# Patient Record
Sex: Female | Born: 1980 | Race: White | Hispanic: No | Marital: Married | Smoking: Never smoker
Health system: Southern US, Community
[De-identification: ages and names within clinical notes are randomized; demographics above are authoritative.]

## PROBLEM LIST (undated history)

## (undated) DIAGNOSIS — O139 Gestational [pregnancy-induced] hypertension without significant proteinuria, unspecified trimester: Secondary | ICD-10-CM

## (undated) DIAGNOSIS — Z789 Other specified health status: Secondary | ICD-10-CM

## (undated) HISTORY — PX: APPENDECTOMY: SHX54

## (undated) HISTORY — DX: Gestational (pregnancy-induced) hypertension without significant proteinuria, unspecified trimester: O13.9

## (undated) HISTORY — PX: CHOLECYSTECTOMY: SHX55

---

## 2015-05-29 LAB — OB RESULTS CONSOLE ANTIBODY SCREEN: ANTIBODY SCREEN: NEGATIVE

## 2015-05-29 LAB — OB RESULTS CONSOLE HEPATITIS B SURFACE ANTIGEN: Hepatitis B Surface Ag: NEGATIVE

## 2015-05-29 LAB — OB RESULTS CONSOLE GC/CHLAMYDIA
Chlamydia: NEGATIVE
Gonorrhea: NEGATIVE

## 2015-05-29 LAB — OB RESULTS CONSOLE ABO/RH: RH Type: POSITIVE

## 2015-05-29 LAB — OB RESULTS CONSOLE RUBELLA ANTIBODY, IGM: Rubella: IMMUNE

## 2015-05-29 LAB — OB RESULTS CONSOLE HIV ANTIBODY (ROUTINE TESTING): HIV: NONREACTIVE

## 2015-06-17 ENCOUNTER — Ambulatory Visit (HOSPITAL_COMMUNITY)
Admission: RE | Admit: 2015-06-17 | Discharge: 2015-06-17 | Disposition: A | Discharge status: Home / Self Care | Payer: 59 | Source: Ambulatory Visit | Attending: Obstetrics & Gynecology | Admitting: Obstetrics & Gynecology

## 2015-06-17 ENCOUNTER — Other Ambulatory Visit (HOSPITAL_COMMUNITY): Payer: Self-pay | Admitting: Obstetrics & Gynecology

## 2015-06-17 ENCOUNTER — Encounter (HOSPITAL_COMMUNITY): Payer: Self-pay

## 2015-06-17 ENCOUNTER — Encounter (HOSPITAL_COMMUNITY): Payer: Self-pay | Admitting: Obstetrics & Gynecology

## 2015-06-17 DIAGNOSIS — Z3689 Encounter for other specified antenatal screening: Secondary | ICD-10-CM

## 2015-06-17 DIAGNOSIS — Z3A18 18 weeks gestation of pregnancy: Secondary | ICD-10-CM

## 2015-06-17 DIAGNOSIS — O289 Unspecified abnormal findings on antenatal screening of mother: Secondary | ICD-10-CM

## 2015-06-17 DIAGNOSIS — O280 Abnormal hematological finding on antenatal screening of mother: Secondary | ICD-10-CM

## 2015-06-17 DIAGNOSIS — Z3A13 13 weeks gestation of pregnancy: Secondary | ICD-10-CM

## 2015-06-17 NOTE — Progress Notes (Signed)
  Genetic Counseling  DOB: March 21, 1980 Referring Provider: Mitchel HonourMorris, Megan, DO Appointment Date: 06/17/2015 Attending: Dr. Alpha GulaPaul Whitecar  [name] and her husband Mr. Katie Smith, were seen for genetic counseling because of an increased risk for fetal Trisomy 18 or 13 based on a first trimester scree.  In summary:  Reviewed results of screening test  Increased risk for Trisomy 18/13  Discussed additional screening options  NIPS - performed today  Ultrasound - already scheduled for June  Discussed diagnostic testing options  Amniocentesis and CVS declined  No family history concerns   They were counseled regarding the screening result and the associated 1 in 6971 risk for fetal trisomy 18/trisomy 13.  We reviewed chromosomes, nondisjunction, and the common features and poor prognosis of trisomy 2118 and trisomy 3713.  In addition, we reviewed the screen adjusted reduction in risks for Down syndrome.  We also discussed other explanations for a screen positive result including: differences in maternal metabolism and normal variation.  We reviewed other available screening options including noninvasive prenatal screening (NIPS)/cell free DNA (cfDNA) screening, and detailed ultrasound.  They were counseled that screening tests are used to modify a patient's a priori risk for aneuploidy, typically based on age. This estimate provides a pregnancy specific risk assessment. We reviewed the benefits and limitations of each option. Specifically, we discussed the conditions for which each test screens, the detection rates, and false positive rates of each. They were also counseled regarding diagnostic testing via CVS or amniocentesis. We reviewed the approximate 1 in 300-500 risk for complications from amniocentesis, including spontaneous pregnancy loss. We discussed the possible results that the tests might provide including: positive, negative, unanticipated, and no result. Finally, they  were counseled regarding the cost of each option and potential out of pocket expenses. After consideration of all the options, they elected to proceed with NIPS.  Those results will be available in 8-10 days.    Medical records indicate that [name] was provided with written information regarding cystic fibrosis (CF) including the carrier frequency and incidence in the Caucasian population, the availability of carrier testing and prenatal diagnosis if indicated. As she previously declined testing we did not discuss that in detail today.   Both family histories were reviewed and found to be noncontributory for birth defects, mental retardation or known genetic conditions. Without further information regarding the provided family history, an accurate genetic risk cannot be calculated. Further genetic counseling is warranted if more information is obtained.  [name] denied exposure to environmental toxins or chemical agents. She denied the use of alcohol, tobacco or street drugs. She denied significant viral illnesses during the course of her pregnancy. Her medical and surgical histories were noncontributory.   I counseled this couple for approximately 45 minutes regarding the above risks and available options.   Katie Gemmaaragh Pebble Botkin, MS,  Certified Genetic Counselor

## 2015-06-24 ENCOUNTER — Encounter (HOSPITAL_COMMUNITY): Payer: Self-pay

## 2015-06-24 ENCOUNTER — Other Ambulatory Visit (HOSPITAL_COMMUNITY): Payer: Self-pay

## 2015-06-25 ENCOUNTER — Telehealth (HOSPITAL_COMMUNITY): Payer: Self-pay | Admitting: Genetics

## 2015-06-25 NOTE — Telephone Encounter (Signed)
Called Katie Smith to discuss her prenatal cell free DNA test results.  [name] had [facility] testing through [facility].  Testing was offered because of an increased risk for Trisomy 13 and 18 based on a first trimester screen.   The patient was identified by name and DOB.  We reviewed that these are within normal limits, showing a less than 1 in 10,000 risk for trisomies 21, 18 and 13, and monosomy X (Turner syndrome).  In addition, the risk for triploidy/vanishing twin and sex chromosome trisomies (47,XXX and 47,XXY) was also low risk.  [name] elected to have cfDNA analysis for 22q11 deletion syndrome, which was also low risk (<1 in 3330).  We reviewed that this testing identifies > 99% of pregnancies with trisomy 9721, trisomy 5413, sex chromosome trisomies (47,XXX and 47,XXY), and triploidy. The detection rate for trisomy 18 is 96%.  The detection rate for monosomy X is ~92%.  The false positive rate is <0.1% for all conditions. Testing was also consistent with female fetal sex.  The patient did not wish to know fetal sex.  She understands that this testing does not identify all genetic conditions.  All questions were answered to her satisfaction, she was encouraged to call with additional questions or concerns.  Katie Gemmaaragh Axel Frisk, MS Certified Genetic Counselor

## 2015-06-26 ENCOUNTER — Other Ambulatory Visit (HOSPITAL_COMMUNITY): Payer: Self-pay

## 2015-07-21 ENCOUNTER — Ambulatory Visit (HOSPITAL_COMMUNITY)
Admission: RE | Admit: 2015-07-21 | Discharge: 2015-07-21 | Disposition: A | Discharge status: Home / Self Care | Payer: 59 | Source: Ambulatory Visit | Attending: Obstetrics & Gynecology | Admitting: Obstetrics & Gynecology

## 2015-07-21 ENCOUNTER — Encounter (HOSPITAL_COMMUNITY): Payer: Self-pay

## 2015-07-21 ENCOUNTER — Ambulatory Visit (HOSPITAL_COMMUNITY): Admission: RE | Admit: 2015-07-21 | Payer: 59 | Source: Ambulatory Visit

## 2015-07-21 ENCOUNTER — Other Ambulatory Visit (HOSPITAL_COMMUNITY): Payer: Self-pay | Admitting: Obstetrics & Gynecology

## 2015-07-21 DIAGNOSIS — Z1389 Encounter for screening for other disorder: Secondary | ICD-10-CM

## 2015-07-21 DIAGNOSIS — O283 Abnormal ultrasonic finding on antenatal screening of mother: Secondary | ICD-10-CM | POA: Insufficient documentation

## 2015-07-21 DIAGNOSIS — Z3689 Encounter for other specified antenatal screening: Secondary | ICD-10-CM

## 2015-07-21 DIAGNOSIS — Z3A18 18 weeks gestation of pregnancy: Secondary | ICD-10-CM | POA: Insufficient documentation

## 2015-07-21 DIAGNOSIS — O280 Abnormal hematological finding on antenatal screening of mother: Secondary | ICD-10-CM

## 2015-07-21 HISTORY — DX: Other specified health status: Z78.9

## 2015-08-21 ENCOUNTER — Ambulatory Visit (HOSPITAL_COMMUNITY)
Admission: RE | Admit: 2015-08-21 | Discharge: 2015-08-21 | Disposition: A | Discharge status: Home / Self Care | Payer: 59 | Source: Ambulatory Visit | Attending: Obstetrics & Gynecology | Admitting: Obstetrics & Gynecology

## 2015-08-21 ENCOUNTER — Encounter (HOSPITAL_COMMUNITY): Payer: Self-pay

## 2015-08-21 ENCOUNTER — Other Ambulatory Visit (HOSPITAL_COMMUNITY): Payer: Self-pay | Admitting: Obstetrics and Gynecology

## 2015-08-21 DIAGNOSIS — O280 Abnormal hematological finding on antenatal screening of mother: Secondary | ICD-10-CM

## 2015-08-21 DIAGNOSIS — O289 Unspecified abnormal findings on antenatal screening of mother: Secondary | ICD-10-CM | POA: Insufficient documentation

## 2015-08-21 DIAGNOSIS — Z3A22 22 weeks gestation of pregnancy: Secondary | ICD-10-CM | POA: Insufficient documentation

## 2015-08-21 DIAGNOSIS — Z0489 Encounter for examination and observation for other specified reasons: Secondary | ICD-10-CM

## 2015-08-21 DIAGNOSIS — Q913 Trisomy 18, unspecified: Secondary | ICD-10-CM | POA: Diagnosis not present

## 2015-08-21 DIAGNOSIS — Z36 Encounter for antenatal screening of mother: Secondary | ICD-10-CM | POA: Insufficient documentation

## 2015-08-21 DIAGNOSIS — IMO0002 Reserved for concepts with insufficient information to code with codable children: Secondary | ICD-10-CM

## 2015-10-02 ENCOUNTER — Ambulatory Visit (HOSPITAL_COMMUNITY)
Admission: RE | Admit: 2015-10-02 | Discharge: 2015-10-02 | Disposition: A | Discharge status: Home / Self Care | Payer: 59 | Source: Ambulatory Visit | Attending: Obstetrics & Gynecology | Admitting: Obstetrics & Gynecology

## 2015-10-02 ENCOUNTER — Encounter (HOSPITAL_COMMUNITY): Payer: Self-pay

## 2015-10-02 ENCOUNTER — Other Ambulatory Visit (HOSPITAL_COMMUNITY): Payer: Self-pay | Admitting: Maternal and Fetal Medicine

## 2015-10-02 VITALS — BP 117/72 | HR 87 | Wt 219.2 lb

## 2015-10-02 DIAGNOSIS — O283 Abnormal ultrasonic finding on antenatal screening of mother: Secondary | ICD-10-CM | POA: Diagnosis not present

## 2015-10-02 DIAGNOSIS — O280 Abnormal hematological finding on antenatal screening of mother: Secondary | ICD-10-CM

## 2015-10-02 DIAGNOSIS — Z3A28 28 weeks gestation of pregnancy: Secondary | ICD-10-CM

## 2015-10-02 DIAGNOSIS — O358XX0 Maternal care for other (suspected) fetal abnormality and damage, not applicable or unspecified: Secondary | ICD-10-CM | POA: Diagnosis present

## 2015-10-02 DIAGNOSIS — O289 Unspecified abnormal findings on antenatal screening of mother: Secondary | ICD-10-CM

## 2015-10-02 DIAGNOSIS — O359XX0 Maternal care for (suspected) fetal abnormality and damage, unspecified, not applicable or unspecified: Secondary | ICD-10-CM

## 2015-10-30 ENCOUNTER — Encounter (HOSPITAL_COMMUNITY): Payer: Self-pay

## 2015-10-30 ENCOUNTER — Ambulatory Visit (HOSPITAL_COMMUNITY)
Admission: RE | Admit: 2015-10-30 | Discharge: 2015-10-30 | Disposition: A | Discharge status: Home / Self Care | Payer: 59 | Source: Ambulatory Visit | Attending: Obstetrics & Gynecology | Admitting: Obstetrics & Gynecology

## 2015-10-30 ENCOUNTER — Other Ambulatory Visit (HOSPITAL_COMMUNITY): Payer: Self-pay | Admitting: *Deleted

## 2015-10-30 DIAGNOSIS — IMO0002 Reserved for concepts with insufficient information to code with codable children: Secondary | ICD-10-CM

## 2015-10-30 DIAGNOSIS — O358XX0 Maternal care for other (suspected) fetal abnormality and damage, not applicable or unspecified: Secondary | ICD-10-CM | POA: Diagnosis present

## 2015-10-30 DIAGNOSIS — Z3A32 32 weeks gestation of pregnancy: Secondary | ICD-10-CM | POA: Diagnosis not present

## 2015-10-30 DIAGNOSIS — O289 Unspecified abnormal findings on antenatal screening of mother: Secondary | ICD-10-CM

## 2015-10-30 DIAGNOSIS — O283 Abnormal ultrasonic finding on antenatal screening of mother: Secondary | ICD-10-CM | POA: Diagnosis not present

## 2015-11-27 ENCOUNTER — Encounter (HOSPITAL_COMMUNITY): Payer: Self-pay

## 2015-11-27 ENCOUNTER — Ambulatory Visit (HOSPITAL_COMMUNITY)
Admission: RE | Admit: 2015-11-27 | Discharge: 2015-11-27 | Disposition: A | Discharge status: Home / Self Care | Payer: 59 | Source: Ambulatory Visit | Attending: Obstetrics & Gynecology | Admitting: Obstetrics & Gynecology

## 2015-11-27 DIAGNOSIS — O358XX0 Maternal care for other (suspected) fetal abnormality and damage, not applicable or unspecified: Secondary | ICD-10-CM | POA: Diagnosis not present

## 2015-11-27 DIAGNOSIS — Z3A36 36 weeks gestation of pregnancy: Secondary | ICD-10-CM | POA: Insufficient documentation

## 2015-11-27 DIAGNOSIS — O283 Abnormal ultrasonic finding on antenatal screening of mother: Secondary | ICD-10-CM | POA: Diagnosis not present

## 2015-11-27 DIAGNOSIS — IMO0002 Reserved for concepts with insufficient information to code with codable children: Secondary | ICD-10-CM

## 2015-12-11 ENCOUNTER — Telehealth (HOSPITAL_COMMUNITY): Payer: Self-pay | Admitting: *Deleted

## 2015-12-11 ENCOUNTER — Encounter (HOSPITAL_COMMUNITY): Payer: Self-pay | Admitting: *Deleted

## 2015-12-11 LAB — OB RESULTS CONSOLE GBS: STREP GROUP B AG: NEGATIVE

## 2015-12-11 NOTE — Telephone Encounter (Signed)
Preadmission screen  

## 2015-12-14 ENCOUNTER — Inpatient Hospital Stay (HOSPITAL_COMMUNITY): Payer: 59 | Admitting: Anesthesiology

## 2015-12-14 ENCOUNTER — Inpatient Hospital Stay (HOSPITAL_COMMUNITY)
Admission: AD | Admit: 2015-12-14 | Discharge: 2015-12-16 | DRG: 775 | Disposition: A | Discharge status: Home / Self Care | Payer: 59 | Source: Ambulatory Visit | Attending: Obstetrics and Gynecology | Admitting: Obstetrics and Gynecology

## 2015-12-14 ENCOUNTER — Inpatient Hospital Stay (HOSPITAL_COMMUNITY)
Admission: RE | Admit: 2015-12-14 | Discharge: 2015-12-14 | Disposition: A | Discharge status: Home / Self Care | Payer: 59 | Source: Ambulatory Visit | Attending: Obstetrics and Gynecology | Admitting: Obstetrics and Gynecology

## 2015-12-14 ENCOUNTER — Encounter (HOSPITAL_COMMUNITY): Payer: Self-pay | Admitting: *Deleted

## 2015-12-14 DIAGNOSIS — Z8249 Family history of ischemic heart disease and other diseases of the circulatory system: Secondary | ICD-10-CM | POA: Diagnosis not present

## 2015-12-14 DIAGNOSIS — O134 Gestational [pregnancy-induced] hypertension without significant proteinuria, complicating childbirth: Principal | ICD-10-CM | POA: Diagnosis present

## 2015-12-14 DIAGNOSIS — O358XX0 Maternal care for other (suspected) fetal abnormality and damage, not applicable or unspecified: Secondary | ICD-10-CM

## 2015-12-14 DIAGNOSIS — Z3A38 38 weeks gestation of pregnancy: Secondary | ICD-10-CM

## 2015-12-14 DIAGNOSIS — O139 Gestational [pregnancy-induced] hypertension without significant proteinuria, unspecified trimester: Secondary | ICD-10-CM | POA: Diagnosis present

## 2015-12-14 DIAGNOSIS — O35EXX0 Maternal care for other (suspected) fetal abnormality and damage, fetal genitourinary anomalies, not applicable or unspecified: Secondary | ICD-10-CM

## 2015-12-14 LAB — COMPREHENSIVE METABOLIC PANEL
ALBUMIN: 2.6 g/dL — AB (ref 3.5–5.0)
ALT: 13 U/L — ABNORMAL LOW (ref 14–54)
ANION GAP: 8 (ref 5–15)
AST: 19 U/L (ref 15–41)
Alkaline Phosphatase: 96 U/L (ref 38–126)
BUN: 11 mg/dL (ref 6–20)
CHLORIDE: 106 mmol/L (ref 101–111)
CO2: 21 mmol/L — ABNORMAL LOW (ref 22–32)
Calcium: 9.3 mg/dL (ref 8.9–10.3)
Creatinine, Ser: 0.72 mg/dL (ref 0.44–1.00)
GFR calc Af Amer: >60 mL/min (ref >60)
GFR calc non Af Amer: >60 mL/min (ref >60)
GLUCOSE: 88 mg/dL (ref 65–99)
POTASSIUM: 3.7 mmol/L (ref 3.5–5.1)
Sodium: 135 mmol/L (ref 135–145)
TOTAL PROTEIN: 6 g/dL — AB (ref 6.5–8.1)
Total Bilirubin: 0.3 mg/dL (ref 0.3–1.2)

## 2015-12-14 LAB — URINALYSIS, ROUTINE W REFLEX MICROSCOPIC
BILIRUBIN URINE: NEGATIVE
Glucose, UA: NEGATIVE mg/dL
Hgb urine dipstick: NEGATIVE
KETONES UR: NEGATIVE mg/dL
LEUKOCYTES UA: NEGATIVE
NITRITE: NEGATIVE
PROTEIN: NEGATIVE mg/dL
Specific Gravity, Urine: 1.015 (ref 1.005–1.030)
pH: 5.5 (ref 5.0–8.0)

## 2015-12-14 LAB — CBC
HCT: 32.3 % — ABNORMAL LOW (ref 36.0–46.0)
HCT: 32.9 % — ABNORMAL LOW (ref 36.0–46.0)
HEMATOCRIT: 33.2 % — AB (ref 36.0–46.0)
HEMOGLOBIN: 11.2 g/dL — AB (ref 12.0–15.0)
Hemoglobin: 11.3 g/dL — ABNORMAL LOW (ref 12.0–15.0)
Hemoglobin: 11.3 g/dL — ABNORMAL LOW (ref 12.0–15.0)
MCH: 30.4 pg (ref 26.0–34.0)
MCH: 30.5 pg (ref 26.0–34.0)
MCH: 31 pg (ref 26.0–34.0)
MCHC: 34 g/dL (ref 30.0–36.0)
MCHC: 34 g/dL (ref 30.0–36.0)
MCHC: 35 g/dL (ref 30.0–36.0)
MCV: 88.5 fL (ref 78.0–100.0)
MCV: 89.2 fL (ref 78.0–100.0)
MCV: 89.7 fL (ref 78.0–100.0)
PLATELETS: 179 10*3/uL (ref 150–400)
PLATELETS: 185 10*3/uL (ref 150–400)
Platelets: 176 10*3/uL (ref 150–400)
RBC: 3.65 MIL/uL — ABNORMAL LOW (ref 3.87–5.11)
RBC: 3.69 MIL/uL — AB (ref 3.87–5.11)
RBC: 3.7 MIL/uL — ABNORMAL LOW (ref 3.87–5.11)
RDW: 13.3 % (ref 11.5–15.5)
RDW: 13.3 % (ref 11.5–15.5)
RDW: 13.4 % (ref 11.5–15.5)
WBC: 12.9 10*3/uL — ABNORMAL HIGH (ref 4.0–10.5)
WBC: 14.3 10*3/uL — AB (ref 4.0–10.5)
WBC: 17.6 10*3/uL — AB (ref 4.0–10.5)

## 2015-12-14 LAB — TYPE AND SCREEN
ABO/RH(D): O POS
Antibody Screen: NEGATIVE

## 2015-12-14 LAB — ABO/RH: ABO/RH(D): O POS

## 2015-12-14 LAB — URIC ACID: URIC ACID, SERUM: 4.1 mg/dL (ref 2.3–6.6)

## 2015-12-14 LAB — RPR: RPR Ser Ql: NONREACTIVE

## 2015-12-14 MED ORDER — OXYTOCIN 40 UNITS IN LACTATED RINGERS INFUSION - SIMPLE MED: 2.5000 [IU]/h | INTRAVENOUS | Status: DC

## 2015-12-14 MED ORDER — PHENYLEPHRINE 40 MCG/ML (10ML) SYRINGE FOR IV PUSH (FOR BLOOD PRESSURE SUPPORT)
80.0000 ug | PREFILLED_SYRINGE | INTRAVENOUS | Status: DC | PRN
  Filled 2015-12-14: qty 5

## 2015-12-14 MED ORDER — COCONUT OIL OIL: 1.0000 "application " | TOPICAL_OIL | Status: DC | PRN

## 2015-12-14 MED ORDER — OXYCODONE-ACETAMINOPHEN 5-325 MG PO TABS: 2.0000 | ORAL_TABLET | ORAL | Status: DC | PRN

## 2015-12-14 MED ORDER — EPHEDRINE 5 MG/ML INJ: 10.0000 mg | INTRAVENOUS | Status: DC | PRN

## 2015-12-14 MED ORDER — LACTATED RINGERS IV SOLN
500.0000 mL | Freq: Once | INTRAVENOUS | Status: AC
  Administered 2015-12-14: 500 mL via INTRAVENOUS

## 2015-12-14 MED ORDER — IBUPROFEN 600 MG PO TABS
600.0000 mg | ORAL_TABLET | Freq: Four times a day (QID) | ORAL | Status: DC
  Administered 2015-12-14 – 2015-12-16 (×6): 600 mg via ORAL
  Filled 2015-12-14 (×6): qty 1

## 2015-12-14 MED ORDER — BENZOCAINE-MENTHOL 20-0.5 % EX AERO
1.0000 "application " | INHALATION_SPRAY | CUTANEOUS | Status: DC | PRN
  Administered 2015-12-14 – 2015-12-15 (×2): 1 via TOPICAL
  Filled 2015-12-14 (×2): qty 56

## 2015-12-14 MED ORDER — SOD CITRATE-CITRIC ACID 500-334 MG/5ML PO SOLN
30.0000 mL | ORAL | Status: DC | PRN
  Administered 2015-12-14: 30 mL via ORAL
  Filled 2015-12-14: qty 15

## 2015-12-14 MED ORDER — FENTANYL 2.5 MCG/ML BUPIVACAINE 1/10 % EPIDURAL INFUSION (WH - ANES)
14.0000 mL/h | INTRAMUSCULAR | Status: DC | PRN
  Administered 2015-12-14: 14 mL/h via EPIDURAL
  Filled 2015-12-14: qty 100

## 2015-12-14 MED ORDER — LACTATED RINGERS IV SOLN: 500.0000 mL | Freq: Once | INTRAVENOUS | Status: DC

## 2015-12-14 MED ORDER — SIMETHICONE 80 MG PO CHEW
80.0000 mg | CHEWABLE_TABLET | ORAL | Status: DC | PRN
Start: 2015-12-14 — End: 2015-12-16

## 2015-12-14 MED ORDER — TERBUTALINE SULFATE 1 MG/ML IJ SOLN
0.2500 mg | Freq: Once | INTRAMUSCULAR | Status: DC | PRN
  Filled 2015-12-14: qty 1

## 2015-12-14 MED ORDER — OXYCODONE HCL 5 MG PO TABS: 10.0000 mg | ORAL_TABLET | ORAL | Status: DC | PRN

## 2015-12-14 MED ORDER — LACTATED RINGERS IV SOLN
INTRAVENOUS | Status: DC
  Administered 2015-12-14 (×2): via INTRAVENOUS

## 2015-12-14 MED ORDER — SENNOSIDES-DOCUSATE SODIUM 8.6-50 MG PO TABS
2.0000 | ORAL_TABLET | ORAL | Status: DC
  Administered 2015-12-15 – 2015-12-16 (×2): 2 via ORAL
  Filled 2015-12-14 (×2): qty 2

## 2015-12-14 MED ORDER — ZOLPIDEM TARTRATE 5 MG PO TABS: 5.0000 mg | ORAL_TABLET | Freq: Every evening | ORAL | Status: DC | PRN

## 2015-12-14 MED ORDER — MISOPROSTOL 25 MCG QUARTER TABLET
25.0000 ug | ORAL_TABLET | ORAL | Status: DC | PRN
  Administered 2015-12-14 (×2): 25 ug via VAGINAL
  Filled 2015-12-14: qty 0.25
  Filled 2015-12-14: qty 1
  Filled 2015-12-14: qty 0.25

## 2015-12-14 MED ORDER — ONDANSETRON HCL 4 MG/2ML IJ SOLN: 4.0000 mg | INTRAMUSCULAR | Status: DC | PRN

## 2015-12-14 MED ORDER — OXYCODONE HCL 5 MG PO TABS
5.0000 mg | ORAL_TABLET | ORAL | Status: DC | PRN
Start: 2015-12-14 — End: 2015-12-16

## 2015-12-14 MED ORDER — DIPHENHYDRAMINE HCL 25 MG PO CAPS: 25.0000 mg | ORAL_CAPSULE | Freq: Four times a day (QID) | ORAL | Status: DC | PRN

## 2015-12-14 MED ORDER — WITCH HAZEL-GLYCERIN EX PADS: 1.0000 "application " | MEDICATED_PAD | CUTANEOUS | Status: DC | PRN

## 2015-12-14 MED ORDER — PHENYLEPHRINE 40 MCG/ML (10ML) SYRINGE FOR IV PUSH (FOR BLOOD PRESSURE SUPPORT)
80.0000 ug | PREFILLED_SYRINGE | INTRAVENOUS | Status: DC | PRN
Start: 2015-12-14 — End: 2015-12-14
  Filled 2015-12-14: qty 5
  Filled 2015-12-14: qty 10

## 2015-12-14 MED ORDER — EPHEDRINE 5 MG/ML INJ
10.0000 mg | INTRAVENOUS | Status: DC | PRN
  Filled 2015-12-14: qty 4

## 2015-12-14 MED ORDER — ONDANSETRON HCL 4 MG/2ML IJ SOLN: 4.0000 mg | Freq: Four times a day (QID) | INTRAMUSCULAR | Status: DC | PRN

## 2015-12-14 MED ORDER — ACETAMINOPHEN 325 MG PO TABS: 650.0000 mg | ORAL_TABLET | ORAL | Status: DC | PRN

## 2015-12-14 MED ORDER — DIPHENHYDRAMINE HCL 50 MG/ML IJ SOLN: 12.5000 mg | INTRAMUSCULAR | Status: DC | PRN

## 2015-12-14 MED ORDER — FLEET ENEMA 7-19 GM/118ML RE ENEM: 1.0000 | ENEMA | RECTAL | Status: DC | PRN

## 2015-12-14 MED ORDER — PRENATAL MULTIVITAMIN CH
1.0000 | ORAL_TABLET | Freq: Every day | ORAL | Status: DC
  Administered 2015-12-15: 1 via ORAL
  Filled 2015-12-14: qty 1

## 2015-12-14 MED ORDER — OXYTOCIN 40 UNITS IN LACTATED RINGERS INFUSION - SIMPLE MED
1.0000 m[IU]/min | INTRAVENOUS | Status: DC
  Administered 2015-12-14: 2 m[IU]/min via INTRAVENOUS
  Filled 2015-12-14: qty 1000

## 2015-12-14 MED ORDER — PHENYLEPHRINE 40 MCG/ML (10ML) SYRINGE FOR IV PUSH (FOR BLOOD PRESSURE SUPPORT): 80.0000 ug | PREFILLED_SYRINGE | INTRAVENOUS | Status: DC | PRN

## 2015-12-14 MED ORDER — DIBUCAINE 1 % RE OINT: 1.0000 "application " | TOPICAL_OINTMENT | RECTAL | Status: DC | PRN

## 2015-12-14 MED ORDER — LACTATED RINGERS IV SOLN: 500.0000 mL | INTRAVENOUS | Status: DC | PRN

## 2015-12-14 MED ORDER — LIDOCAINE HCL (PF) 1 % IJ SOLN
30.0000 mL | INTRAMUSCULAR | Status: DC | PRN
  Administered 2015-12-14: 30 mL via SUBCUTANEOUS
  Filled 2015-12-14: qty 30

## 2015-12-14 MED ORDER — BUTORPHANOL TARTRATE 1 MG/ML IJ SOLN
1.0000 mg | INTRAMUSCULAR | Status: DC | PRN
  Administered 2015-12-14: 1 mg via INTRAVENOUS
  Filled 2015-12-14: qty 1

## 2015-12-14 MED ORDER — TETANUS-DIPHTH-ACELL PERTUSSIS 5-2.5-18.5 LF-MCG/0.5 IM SUSP: 0.5000 mL | Freq: Once | INTRAMUSCULAR | Status: DC

## 2015-12-14 MED ORDER — OXYCODONE-ACETAMINOPHEN 5-325 MG PO TABS: 1.0000 | ORAL_TABLET | ORAL | Status: DC | PRN

## 2015-12-14 MED ORDER — ONDANSETRON HCL 4 MG PO TABS: 4.0000 mg | ORAL_TABLET | ORAL | Status: DC | PRN

## 2015-12-14 MED ORDER — OXYTOCIN BOLUS FROM INFUSION
500.0000 mL | Freq: Once | INTRAVENOUS | Status: AC
  Administered 2015-12-14: 500 mL via INTRAVENOUS

## 2015-12-14 MED ORDER — LIDOCAINE HCL (PF) 1 % IJ SOLN
INTRAMUSCULAR | Status: AC | PRN
  Administered 2015-12-14: 2 mL
  Administered 2015-12-14: 5 mL
  Administered 2015-12-14: 3 mL

## 2015-12-14 NOTE — Anesthesia Preprocedure Evaluation (Signed)
Anesthesia Evaluation  Patient identified by MRN, date of birth, ID band Patient awake    Reviewed: Allergy & Precautions, Patient's Chart, lab work & pertinent test results  Airway Mallampati: II  TM Distance: >3 FB     Dental   Pulmonary    breath sounds clear to auscultation       Cardiovascular hypertension,  Rhythm:Regular Rate:Normal     Neuro/Psych    GI/Hepatic   Endo/Other    Renal/GU      Musculoskeletal   Abdominal   Peds  Hematology   Anesthesia Other Findings   Reproductive/Obstetrics (+) Pregnancy                             Lab Results  Component Value Date   WBC 14.3 (H) 12/14/2015   HGB 11.2 (L) 12/14/2015   HCT 32.9 (L) 12/14/2015   MCV 89.2 12/14/2015   PLT 179 12/14/2015   Lab Results  Component Value Date   CREATININE 0.72 12/14/2015   BUN 11 12/14/2015   NA 135 12/14/2015   K 3.7 12/14/2015   CL 106 12/14/2015   CO2 21 (L) 12/14/2015    Anesthesia Physical Anesthesia Plan  ASA: III  Anesthesia Plan: Epidural   Post-op Pain Management:    Induction:   Airway Management Planned: Natural Airway  Additional Equipment:   Intra-op Plan:   Post-operative Plan:   Informed Consent: I have reviewed the patients History and Physical, chart, labs and discussed the procedure including the risks, benefits and alternatives for the proposed anesthesia with the patient or authorized representative who has indicated his/her understanding and acceptance.     Plan Discussed with:   Anesthesia Plan Comments:         Anesthesia Quick Evaluation

## 2015-12-14 NOTE — Anesthesia Pain Management Evaluation Note (Signed)
  CRNA Pain Management Visit Note  Patient: Clance BollJennifer Eskelson, 35 y.o., female  "Hello I am a member of the anesthesia team at [facility]. We have an anesthesia team available at all times to provide care throughout the hospital, including epidural management and anesthesia for C-section. I don't know your plan for the delivery whether it a natural birth, water birth, IV sedation, nitrous supplementation, doula or epidural, but we want to meet your pain goals."   1.Was your pain managed to your expectations on prior hospitalizations?   Yes   2.What is your expectation for pain management during this hospitalization?     Epidural  3.How can we help you reach that goal? epidural  Record the patient's initial score and the patient's pain goal.   Pain: 3  Pain Goal: 6 [facility] wants you to be able to say your pain was always managed very well.  [name],Jacqueleen Pulver 12/14/2015

## 2015-12-14 NOTE — Lactation Note (Signed)
This note was copied from a baby's chart. Lactation Consultation Note  Patient Name: Katie Smith GEXBM'WToday's Date: 12/14/2015 Reason for consult: Initial assessment Baby at 5 hr of life. During this visit baby is gagging and spitting up. Mom had tried to latch baby without success so she has been spoon feeding. Mom has large breast pendulous breast with flat nipples. She is wearing inverted nipple shells and using Harmony to stimulate her nipples. Baby was not cueing and did not appear eager to latch. Discussed baby behavior, feeding frequency, baby belly size, voids, wt loss, breast changes, and nipple care. Mom can manually express and has spoons in room. Given lactation handouts. Aware of OP services and support group.    Maternal Data Has patient been taught Hand Expression?: Yes Does the patient have breastfeeding experience prior to this delivery?: No  Feeding Feeding Type: Breast Fed Length of feed: 1 min (few sucks)  LATCH Score/Interventions Latch: Too sleepy or reluctant, no latch achieved, no sucking elicited. Intervention(s): Skin to skin  Audible Swallowing: None Intervention(s): Skin to skin;Hand expression  Type of Nipple: Flat Intervention(s): Shells;Hand pump  Comfort (Breast/Nipple): Soft / non-tender     Hold (Positioning): Assistance needed to correctly position infant at breast and maintain latch. Intervention(s): Breastfeeding basics reviewed;Support Pillows;Position options;Skin to skin  LATCH Score: 4  Lactation Tools Discussed/Used WIC Program: No   Consult Status Consult Status: Follow-up Date: 12/15/15 Follow-up type: In-patient    Katie Smith 12/14/2015, 10:43 PM

## 2015-12-14 NOTE — Progress Notes (Signed)
Delivery Note At 4:50 PM a viable female was delivered via Vaginal, Spontaneous Delivery (Presentation:ROA ;  ).  APGAR: , ; weight pending  .   Placenta status: intact,to Pathology .  Cord: 3 vessels with the following complications: .  Cord pH: not done  Rapid second stage, pushed over 2 contractions.  Anesthesia:epidural, 1% lidocaine local   Episiotomy: Median second degree due to rapid progress Lacerations: Periurethral;2nd degree;Perineal Suture Repair: 2.0 vicryl rapide Est. Blood Loss (mL):  300  Mom to postpartum.  Baby to Couplet care / Skin to Skin.  Carlyle Achenbach II,Kaleiah Kutzer E 12/14/2015, 5:07 PM

## 2015-12-14 NOTE — H&P (Signed)
Katie Smith is a 35 y.o. female presenting for IOL for PIH. BP in office was 134/92, 140/82. U/S on 12/10/15 showed EFW=7#8oz, vtx, AFI = 70%. No HA, vision change or epigastric pain. OB History    Gravida Para Term Preterm AB Living   2       1 0   SAB TAB Ectopic Multiple Live Births   1             Past Medical History:  Diagnosis Date  . Medical history non-contributory   . Pregnancy induced hypertension    Past Surgical History:  Procedure Laterality Date  . APPENDECTOMY    . CHOLECYSTECTOMY     Family History: family history includes Cancer in her maternal grandmother; Heart disease in her maternal grandfather, paternal grandfather, and paternal grandmother; Rheum arthritis in her paternal grandmother; Thyroid disease in her paternal aunt and paternal grandfather. Social History:  reports that she has never smoked. She has never used smokeless tobacco. She reports that she does not drink alcohol or use drugs.     Maternal Diabetes: No Genetic Screening: Normal Maternal Ultrasounds/Referrals: Normal Fetal Ultrasounds or other Referrals:  None Maternal Substance Abuse:  No Significant Maternal Medications:  None Significant Maternal Lab Results:  None Other Comments:  None  Review of Systems  Eyes: Negative for blurred vision.  Gastrointestinal: Negative for abdominal pain.  Neurological: Negative for headaches.   Maternal Medical History:  Fetal activity: Perceived fetal activity is normal.      Dilation: 3 Effacement (%): 60 Station: Ballotable Exam by:: Dr.Jaisen Wiltrout Blood pressure 126/83, pulse 87, temperature 98.1 F (36.7 C), temperature source Oral, resp. rate 16, height 5\' 7"  (1.702 m), weight 240 lb (108.9 kg), last menstrual period 03/17/2015. Maternal Exam:  Uterine Assessment: Contraction strength is mild.  Contraction frequency is irregular.   Abdomen: Patient reports no abdominal tenderness. Fetal presentation: vertex     Fetal Exam Fetal  State Assessment: Category I - tracings are normal.     Physical Exam  Cardiovascular: Normal rate and regular rhythm.   Respiratory: Effort normal and breath sounds normal.  GI: Soft. There is no tenderness.  Neurological: She has normal reflexes.    Cx 3/60/ballotable/very soft  Prenatal labs: ABO, Rh: --/--/O POS, O POS (11/06 0045) Antibody: NEG (11/06 0045) Rubella: Immune (04/21 0000) RPR:    HBsAg: Negative (04/21 0000)  HIV: Non-reactive (04/21 0000)  GBS: Negative (11/03 0000)   Results for orders placed or performed during the hospital encounter of 12/14/15 (from the past 24 hour(s))  CBC     Status: Abnormal   Collection Time: 12/14/15 12:45 AM  Result Value Ref Range   WBC 12.9 (H) 4.0 - 10.5 K/uL   RBC 3.65 (L) 3.87 - 5.11 MIL/uL   Hemoglobin 11.3 (L) 12.0 - 15.0 g/dL   HCT 09.832.3 (L) 11.936.0 - 14.746.0 %   MCV 88.5 78.0 - 100.0 fL   MCH 31.0 26.0 - 34.0 pg   MCHC 35.0 30.0 - 36.0 g/dL   RDW 82.913.3 56.211.5 - 13.015.5 %   Platelets 185 150 - 400 K/uL  Type and screen [facility]     Status: None   Collection Time: 12/14/15 12:45 AM  Result Value Ref Range   ABO/RH(D) O POS    Antibody Screen NEG    Sample Expiration 12/17/2015   Comprehensive metabolic panel     Status: Abnormal   Collection Time: 12/14/15 12:45 AM  Result Value Ref Range  Sodium 135 135 - 145 mmol/L   Potassium 3.7 3.5 - 5.1 mmol/L   Chloride 106 101 - 111 mmol/L   CO2 21 (L) 22 - 32 mmol/L   Glucose, Bld 88 65 - 99 mg/dL   BUN 11 6 - 20 mg/dL   Creatinine, Ser 1.610.72 0.44 - 1.00 mg/dL   Calcium 9.3 8.9 - 09.610.3 mg/dL   Total Protein 6.0 (L) 6.5 - 8.1 g/dL   Albumin 2.6 (L) 3.5 - 5.0 g/dL   AST 19 15 - 41 U/L   ALT 13 (L) 14 - 54 U/L   Alkaline Phosphatase 96 38 - 126 U/L   Total Bilirubin 0.3 0.3 - 1.2 mg/dL   GFR calc non Af Amer >60 >60 mL/min   GFR calc Af Amer >60 >60 mL/min   Anion gap 8 5 - 15  Uric acid     Status: None   Collection Time: 12/14/15 12:45 AM  Result  Value Ref Range   Uric Acid, Serum 4.1 2.3 - 6.6 mg/dL  Urinalysis, Routine w reflex microscopic (not at [facility])     Status: None   Collection Time: 12/14/15  1:30 AM  Result Value Ref Range   Color, Urine YELLOW YELLOW   APPearance CLEAR CLEAR   Specific Gravity, Urine 1.015 1.005 - 1.030   pH 5.5 5.0 - 8.0   Glucose, UA NEGATIVE NEGATIVE mg/dL   Hgb urine dipstick NEGATIVE NEGATIVE   Bilirubin Urine NEGATIVE NEGATIVE   Ketones, ur NEGATIVE NEGATIVE mg/dL   Protein, ur NEGATIVE NEGATIVE mg/dL   Nitrite NEGATIVE NEGATIVE   Leukocytes, UA NEGATIVE NEGATIVE   Assessment/Plan: 35 yo G2P0 @ 38 6/7 weeks with PIH D/W patient and husband two stage induction, risks Begin pitocin @ 9:30 am (last Cytotec @ 5:30am)   Katie Smith,Katie Smith 12/14/2015, 8:05 AM

## 2015-12-14 NOTE — Anesthesia Procedure Notes (Signed)
Epidural Patient location during procedure: OB  Staffing Anesthesiologist: Marcene DuosFITZGERALD, Phillippe Orlick Performed: anesthesiologist   Preanesthetic Checklist Completed: patient identified, site marked, surgical consent, pre-op evaluation, timeout performed, IV checked, risks and benefits discussed and monitors and equipment checked  Epidural Patient position: sitting Prep: site prepped and draped and DuraPrep Patient monitoring: continuous pulse ox and blood pressure Approach: midline Location: L4-L5 Injection technique: LOR saline  Needle:  Needle type: Tuohy  Needle gauge: 17 G Needle length: 9 cm and 9 Catheter type: closed end flexible Catheter size: 19 Gauge Catheter at skin depth: 17 cm Test dose: negative  Assessment Events: blood not aspirated, injection not painful, no injection resistance, negative IV test and no paresthesia

## 2015-12-14 NOTE — Progress Notes (Signed)
Cx 4/80/-2 AROM clear FHT cat one UCs q 1.5-3 min

## 2015-12-15 LAB — CBC
HEMATOCRIT: 31.6 % — AB (ref 36.0–46.0)
Hemoglobin: 10.7 g/dL — ABNORMAL LOW (ref 12.0–15.0)
MCH: 30.4 pg (ref 26.0–34.0)
MCHC: 33.9 g/dL (ref 30.0–36.0)
MCV: 89.8 fL (ref 78.0–100.0)
Platelets: 178 10*3/uL (ref 150–400)
RBC: 3.52 MIL/uL — ABNORMAL LOW (ref 3.87–5.11)
RDW: 13.5 % (ref 11.5–15.5)
WBC: 14.9 10*3/uL — ABNORMAL HIGH (ref 4.0–10.5)

## 2015-12-15 NOTE — Lactation Note (Signed)
This note was copied from a baby's chart. Lactation Consultation Note New mom wearing shells in bra d/t flat nipples. Breast/areola/nipples compressible. Shells helpful to evert nipple. Hand expression w/easy flow colostrum. Mom has been hand expressing and giving colostrum in spoon. Baby hasn't been interested in BF. Been very spitty.  Baby was weight 2 times this evening. Suspect last weight in error. Mom is to call out when baby wakens for re-weight. Encouraged to write all spits, feeding, I&O.  Patient Name: Boy Clance BollJennifer Chimenti ZOXWR'UToday's Date: 12/15/2015 Reason for consult: Follow-up assessment;Infant weight loss   Maternal Data Has patient been taught Hand Expression?: Yes Does the patient have breastfeeding experience prior to this delivery?: No  Feeding Feeding Type: Breast Fed Length of feed: 0 min (gave a few drops of colostrum)  LATCH Score/Interventions       Type of Nipple: Flat Intervention(s): Shells;Hand pump        Intervention(s): Breastfeeding basics reviewed;Support Pillows;Position options;Skin to skin     Lactation Tools Discussed/Used Tools: Shells;Pump Shell Type: Inverted Breast pump type: Manual WIC Program: No Pump Review: Setup, frequency, and cleaning;Milk Storage Initiated by:: Rn Date initiated:: 12/14/15   Consult Status Consult Status: Follow-up Date: 12/15/15 Follow-up type: In-patient    Peyton Spengler, Diamond NickelLAURA G 12/15/2015, 2:29 AM

## 2015-12-15 NOTE — Anesthesia Postprocedure Evaluation (Signed)
Anesthesia Post Note  Patient: Katie Smith  Procedure(s) Performed: * No procedures listed *  Patient location during evaluation: Mother Baby Anesthesia Type: Epidural Level of consciousness: awake Pain management: pain level controlled Vital Signs Assessment: post-procedure vital signs reviewed and stable Respiratory status: spontaneous breathing Cardiovascular status: stable Postop Assessment: no headache, no backache and epidural receding Anesthetic complications: no     Last Vitals:  Vitals:   12/15/15 0105 12/15/15 0612  BP: 125/77 137/76  Pulse: 81 86  Resp: 18 18  Temp: 36.7 C 36.6 C    Last Pain:  Vitals:   12/15/15 0612  TempSrc: Oral  PainSc:    Pain Goal:                 [name],[name]

## 2015-12-15 NOTE — Lactation Note (Signed)
This note was copied from a baby's chart. Lactation Consultation Note  Follow up visit at 28 hours of age. Mom reports having difficulty getting baby latched and RN reports having difficulty as well.  Baby has had some spoon feedings, 3 voids 4 stools and several times with emesis.  Mom attempting latch now and reports baby is showing some feeding cues.  Baby in modified football hold mouthing tip of nipple.  Mom has large pendulous breasts with soft flat nipples.  Baby noted to have short tight anterior lingual frenulum.  Baby has heart shaped tongue when tongue is pulled in mouth behind lower gumline.  Baby does not elevate or extend tongue well.  Baby noted to have jaw quiver with tight upper body tone.  LC attempted to latch baby with "teacup" hold baby sucks 2 times and then stops with jaw quivering.  Baby is having difficulty holding breast.  LC allowed baby to suck on gloved finger, baby sucks 2x, then bites a few times and stops sucking attempt.    LC assisted with hand expression with about 4ml of EBM spoon fed to baby who did not extend tongue well to spoon for feeding.  LC fit mom with #20 NS with poor fit, #16 appears to small but holds nipple better.  Baby latched with #20NS for a few minutes with stimulation to maintain feeding.  Colostrum noted in NS.  Baby too sleepy to maintain feeding and when removed from breast began to show more feeding cues.  Discussed supplementation of alimentum formula.  Baby is noted to have strong jittery movement of extremities even when calmed.  Parents report as normal for baby with blood sugars checked yesterday WNL.   Baby also noted to have a lot of sneezing (~8x).  LC assisted with latching baby with #20 NS and supplementing 7mls of alimentum with syringe at the breast.  Baby took 5mls well and then needed stimulation to take additional 2mls.  LC instructed FOB and parents on feeding and mom to call RN for assist at next feeding as needed.    Plan is for mom  to wear shells and pre pump prior to feedings.  Mom to use #20 NS if she can get a good fit, mom will use #16 NS to start feeding as needed. Mom to use formula to get feeding started for baby as needed, but encouraged to allow baby time at the breast to transfer and stimulate milk production.  Mom to offer supplement as tolerated by baby.  Mom to post pump for 15 minutes followed by hand expression.  DEBP was set up by RN, LC reviewed cleaning and frequency. Parents may finger feed with syringe if baby is not tolerating/sucking well at the breast.   Paci use discouraged until breastfeeding is well established.    Report [facility] to [facility] RN, Marcelino DusterMichelle.         Patient Name: Katie Clance BollJennifer Crate ZOXWR'UToday's Date: 12/15/2015     Maternal Data    Feeding    LATCH Score/Interventions                      Lactation Tools Discussed/Used Tools: Pump Breast pump type: Double-Electric Breast Pump   Consult Status      Mackensie Pilson, Arvella MerlesJana Lynn 12/15/2015, 8:55 PM

## 2015-12-15 NOTE — Progress Notes (Signed)
Post Partum Day 1 Subjective: no complaints, up ad lib, voiding, tolerating PO, + flatus and Baby is scheduled for renal u/s this am to evaluate bilateral pelvietasis seen on prenatal u/s  Objective: Blood pressure 137/76, pulse 86, temperature 97.8 F (36.6 C), temperature source Oral, resp. rate 18, height 5\' 7"  (1.702 m), weight 240 lb (108.9 kg), last menstrual period 03/17/2015, SpO2 98 %, unknown if currently breastfeeding.  Physical Exam:  General: alert and cooperative Lochia: appropriate Uterine Fundus: firm Incision: healing well DVT Evaluation: No evidence of DVT seen on physical exam. Negative Homan's sign. No cords or calf tenderness. No significant calf/ankle edema.   Recent Labs  12/14/15 1859 12/15/15 0612  HGB 11.3* 10.7*  HCT 33.2* 31.6*    Assessment/Plan: Plan for discharge tomorrow and Circumcision prior to discharge   LOS: 1 day   Destine Zirkle G 12/15/2015, 8:13 AM

## 2015-12-16 MED ORDER — IBUPROFEN 600 MG PO TABS
600.0000 mg | ORAL_TABLET | Freq: Four times a day (QID) | ORAL | 1 refills | Status: DC
Start: 2015-12-16 — End: 2017-11-06

## 2015-12-16 NOTE — Discharge Summary (Signed)
Obstetric Discharge Summary Reason for Admission: induction of labor Prenatal Procedures: ultrasound Intrapartum Procedures: spontaneous vaginal delivery Postpartum Procedures: none Complications-Operative and Postpartum: 2 degree perineal laceration Hemoglobin  Date Value Ref Range Status  12/15/2015 10.7 (L) 12.0 - 15.0 g/dL Final   HCT  Date Value Ref Range Status  12/15/2015 31.6 (L) 36.0 - 46.0 % Final    Physical Exam:  General: alert and cooperative Lochia: appropriate Uterine Fundus: firm Incision: healing well DVT Evaluation: No evidence of DVT seen on physical exam. Negative Homan's sign. No cords or calf tenderness. Calf/Ankle edema is present.  Discharge Diagnoses: Term Pregnancy-delivered  Discharge Information: Date: 12/16/2015 Activity: pelvic rest Diet: routine Medications: PNV and Ibuprofen Condition: stable Instructions: refer to practice specific booklet Discharge to: home   Newborn Data: Live born female  Birth Weight: 7 lb 4.8 oz (3311 g) APGAR: 9, 9  Home with mother and desires circ prior to discharge.  Livio Ledwith G 12/16/2015, 8:32 AM

## 2015-12-16 NOTE — Lactation Note (Signed)
This note was copied from a baby's chart. Lactation Consultation Note  Attempted to see this patient twice but patient was not in the room. Note reports that she plans to pump and bottle feed at home. Patient Name: Katie Smith ZOXWR'UToday's Date: 12/16/2015     Maternal Data    Feeding    LATCH Score/Interventions                      Lactation Tools Discussed/Used     Consult Status      Soyla DryerJoseph, [name] 12/16/2015, 12:48 PM

## 2017-02-07 NOTE — L&D Delivery Note (Signed)
Delivery Note At 4:49 PM a viable female was delivered via Vaginal, Spontaneous (Presentation: OA ;  ).  APGAR: 8, ; weight  .   Placenta status: routine , .  Cord: 3VC  with the following complications: nuchal x 1.  Cord pH: not sent  Anesthesia:   Episiotomy: None Lacerations:  Peri-urethral, 1st degree Suture Repair: 3.0 vicryl Est. Blood Loss (mL):  200cc   It's a girl - "Pearson Grippe" to join brother at home! Mom to postpartum.  Baby to Couplet care / Skin to Skin.  Marieelena Bartko Drucella Karbowski 11/06/2017, 5:09 PM

## 2017-04-11 LAB — OB RESULTS CONSOLE HIV ANTIBODY (ROUTINE TESTING): HIV: NONREACTIVE

## 2017-04-11 LAB — OB RESULTS CONSOLE RUBELLA ANTIBODY, IGM: Rubella: IMMUNE

## 2017-04-11 LAB — OB RESULTS CONSOLE GC/CHLAMYDIA
Chlamydia: NEGATIVE
Gonorrhea: NEGATIVE

## 2017-04-11 LAB — OB RESULTS CONSOLE ABO/RH: RH TYPE: POSITIVE

## 2017-04-11 LAB — OB RESULTS CONSOLE ANTIBODY SCREEN: ANTIBODY SCREEN: NEGATIVE

## 2017-04-11 LAB — OB RESULTS CONSOLE HEPATITIS B SURFACE ANTIGEN: HEP B S AG: NEGATIVE

## 2017-04-11 LAB — OB RESULTS CONSOLE RPR: RPR: NONREACTIVE

## 2017-10-27 ENCOUNTER — Encounter (HOSPITAL_COMMUNITY): Payer: Self-pay | Admitting: *Deleted

## 2017-10-27 ENCOUNTER — Telehealth (HOSPITAL_COMMUNITY): Payer: Self-pay | Admitting: *Deleted

## 2017-10-27 NOTE — Telephone Encounter (Signed)
Preadmission screen  

## 2017-10-31 ENCOUNTER — Encounter (HOSPITAL_COMMUNITY): Payer: Self-pay | Admitting: *Deleted

## 2017-10-31 ENCOUNTER — Telehealth (HOSPITAL_COMMUNITY): Payer: Self-pay | Admitting: *Deleted

## 2017-10-31 NOTE — Telephone Encounter (Signed)
Preadmission screen  

## 2017-11-03 NOTE — H&P (Signed)
Katie Smith is a 37 y.o. female presenting for multip IOL. OB History    Gravida  3   Para  1   Term  1   Preterm      AB  1   Living  1     SAB  1   TAB      Ectopic      Multiple  0   Live Births  1          Past Medical History:  Diagnosis Date  . Medical history non-contributory   . Pregnancy induced hypertension    Past Surgical History:  Procedure Laterality Date  . APPENDECTOMY    . CHOLECYSTECTOMY     Family History: family history includes Cancer in her maternal grandmother; Heart disease in her maternal grandfather, paternal grandfather, and paternal grandmother; Rheum arthritis in her paternal grandmother; Thyroid disease in her paternal aunt and paternal grandfather. Social History:  reports that she has never smoked. She has never used smokeless tobacco. She reports that she does not drink alcohol or use drugs.     Maternal Diabetes: No Genetic Screening: Normal Maternal Ultrasounds/Referrals: Normal Fetal Ultrasounds or other Referrals:  None except pelviectasis which resolved Maternal Substance Abuse:  No Significant Maternal Medications:  None Significant Maternal Lab Results:  None Other Comments:  None  ROS History   Last menstrual period 02/06/2017, unknown if currently breastfeeding. Exam Physical Exam  (from office) NAD, A&O NWOB Abd soft, nondistended, gravid  Prenatal labs: ABO, Rh: O/Positive/-- (03/05 0000) Antibody: n (03/05 0000) Rubella: Immune (03/05 0000) RPR: Nonreactive (03/05 0000)  HBsAg: Negative (03/05 0000)  HIV: Non-reactive (03/05 0000)  GBS:   neg  Assessment/Plan: 37yo G3P1011 @ 39.0wga presenting for IOL. Prior NSVD.x1 AMA - panorama low risk female  EFW 85%-ile at 4 wga SVE 2cm in office GBS neg   Katie Smith 11/03/2017, 2:21 PM

## 2017-11-06 ENCOUNTER — Inpatient Hospital Stay (HOSPITAL_COMMUNITY): Payer: 59 | Admitting: Anesthesiology

## 2017-11-06 ENCOUNTER — Encounter (HOSPITAL_COMMUNITY): Payer: Self-pay

## 2017-11-06 ENCOUNTER — Other Ambulatory Visit: Payer: Self-pay

## 2017-11-06 ENCOUNTER — Inpatient Hospital Stay (HOSPITAL_COMMUNITY)
Admission: RE | Admit: 2017-11-06 | Discharge: 2017-11-08 | DRG: 807 | Disposition: A | Discharge status: Home / Self Care | Payer: 59 | Attending: Obstetrics and Gynecology | Admitting: Obstetrics and Gynecology

## 2017-11-06 VITALS — BP 127/77 | HR 85 | Temp 97.8°F | Resp 14 | Ht 68.0 in | Wt 268.7 lb

## 2017-11-06 DIAGNOSIS — Z3A39 39 weeks gestation of pregnancy: Secondary | ICD-10-CM

## 2017-11-06 DIAGNOSIS — Z3490 Encounter for supervision of normal pregnancy, unspecified, unspecified trimester: Secondary | ICD-10-CM

## 2017-11-06 DIAGNOSIS — Z3483 Encounter for supervision of other normal pregnancy, third trimester: Secondary | ICD-10-CM | POA: Diagnosis present

## 2017-11-06 LAB — CBC
HEMATOCRIT: 33.8 % — AB (ref 36.0–46.0)
Hemoglobin: 11.2 g/dL — ABNORMAL LOW (ref 12.0–15.0)
MCH: 30.3 pg (ref 26.0–34.0)
MCHC: 33.1 g/dL (ref 30.0–36.0)
MCV: 91.4 fL (ref 78.0–100.0)
Platelets: 177 10*3/uL (ref 150–400)
RBC: 3.7 MIL/uL — ABNORMAL LOW (ref 3.87–5.11)
RDW: 14.1 % (ref 11.5–15.5)
WBC: 10.4 10*3/uL (ref 4.0–10.5)

## 2017-11-06 LAB — TYPE AND SCREEN
ABO/RH(D): O POS
ANTIBODY SCREEN: NEGATIVE

## 2017-11-06 LAB — RPR: RPR Ser Ql: NONREACTIVE

## 2017-11-06 MED ORDER — PRENATAL MULTIVITAMIN CH
1.0000 | ORAL_TABLET | Freq: Every day | ORAL | Status: DC
  Administered 2017-11-07: 1 via ORAL
  Filled 2017-11-06: qty 1

## 2017-11-06 MED ORDER — OXYTOCIN BOLUS FROM INFUSION
500.0000 mL | Freq: Once | INTRAVENOUS | Status: AC
  Administered 2017-11-06: 500 mL via INTRAVENOUS

## 2017-11-06 MED ORDER — OXYCODONE-ACETAMINOPHEN 5-325 MG PO TABS: 1.0000 | ORAL_TABLET | ORAL | Status: DC | PRN

## 2017-11-06 MED ORDER — LACTATED RINGERS IV SOLN
INTRAVENOUS | Status: DC
  Administered 2017-11-06 (×2): via INTRAVENOUS

## 2017-11-06 MED ORDER — OXYCODONE HCL 5 MG PO TABS: 5.0000 mg | ORAL_TABLET | ORAL | Status: DC | PRN

## 2017-11-06 MED ORDER — IBUPROFEN 600 MG PO TABS
600.0000 mg | ORAL_TABLET | Freq: Four times a day (QID) | ORAL | Status: DC
  Administered 2017-11-06 – 2017-11-08 (×6): 600 mg via ORAL
  Filled 2017-11-06 (×6): qty 1

## 2017-11-06 MED ORDER — OXYCODONE-ACETAMINOPHEN 5-325 MG PO TABS: 2.0000 | ORAL_TABLET | ORAL | Status: DC | PRN

## 2017-11-06 MED ORDER — LIDOCAINE HCL (PF) 1 % IJ SOLN
30.0000 mL | INTRAMUSCULAR | Status: DC | PRN
  Filled 2017-11-06: qty 30

## 2017-11-06 MED ORDER — LIDOCAINE-EPINEPHRINE (PF) 2 %-1:200000 IJ SOLN
INTRAMUSCULAR | Status: DC | PRN
  Administered 2017-11-06 (×2): 4 mL via EPIDURAL

## 2017-11-06 MED ORDER — SOD CITRATE-CITRIC ACID 500-334 MG/5ML PO SOLN
30.0000 mL | ORAL | Status: DC | PRN
  Administered 2017-11-06: 30 mL via ORAL
  Filled 2017-11-06: qty 15

## 2017-11-06 MED ORDER — ACETAMINOPHEN 325 MG PO TABS: 650.0000 mg | ORAL_TABLET | ORAL | Status: DC | PRN

## 2017-11-06 MED ORDER — ACETAMINOPHEN 325 MG PO TABS
650.0000 mg | ORAL_TABLET | ORAL | Status: DC | PRN
  Administered 2017-11-06 – 2017-11-07 (×3): 650 mg via ORAL
  Filled 2017-11-06 (×3): qty 2

## 2017-11-06 MED ORDER — LACTATED RINGERS IV SOLN: 500.0000 mL | INTRAVENOUS | Status: DC | PRN

## 2017-11-06 MED ORDER — TETANUS-DIPHTH-ACELL PERTUSSIS 5-2.5-18.5 LF-MCG/0.5 IM SUSP: 0.5000 mL | Freq: Once | INTRAMUSCULAR | Status: DC

## 2017-11-06 MED ORDER — TERBUTALINE SULFATE 1 MG/ML IJ SOLN
0.2500 mg | Freq: Once | INTRAMUSCULAR | Status: DC | PRN
  Filled 2017-11-06: qty 1

## 2017-11-06 MED ORDER — SIMETHICONE 80 MG PO CHEW: 80.0000 mg | CHEWABLE_TABLET | ORAL | Status: DC | PRN

## 2017-11-06 MED ORDER — DIPHENHYDRAMINE HCL 50 MG/ML IJ SOLN: 12.5000 mg | INTRAMUSCULAR | Status: DC | PRN

## 2017-11-06 MED ORDER — ONDANSETRON HCL 4 MG/2ML IJ SOLN: 4.0000 mg | INTRAMUSCULAR | Status: DC | PRN

## 2017-11-06 MED ORDER — OXYTOCIN 40 UNITS IN LACTATED RINGERS INFUSION - SIMPLE MED
1.0000 m[IU]/min | INTRAVENOUS | Status: DC
  Administered 2017-11-06: 2 m[IU]/min via INTRAVENOUS
  Filled 2017-11-06: qty 1000

## 2017-11-06 MED ORDER — COCONUT OIL OIL
1.0000 "application " | TOPICAL_OIL | Status: DC | PRN
  Administered 2017-11-07: 1 via TOPICAL
  Filled 2017-11-06: qty 120

## 2017-11-06 MED ORDER — DIPHENHYDRAMINE HCL 25 MG PO CAPS: 25.0000 mg | ORAL_CAPSULE | Freq: Four times a day (QID) | ORAL | Status: DC | PRN

## 2017-11-06 MED ORDER — FENTANYL 2.5 MCG/ML BUPIVACAINE 1/10 % EPIDURAL INFUSION (WH - ANES)
14.0000 mL/h | INTRAMUSCULAR | Status: DC | PRN
  Administered 2017-11-06 (×2): 14 mL/h via EPIDURAL
  Filled 2017-11-06 (×2): qty 100

## 2017-11-06 MED ORDER — OXYCODONE HCL 5 MG PO TABS: 10.0000 mg | ORAL_TABLET | ORAL | Status: DC | PRN

## 2017-11-06 MED ORDER — ONDANSETRON HCL 4 MG PO TABS: 4.0000 mg | ORAL_TABLET | ORAL | Status: DC | PRN

## 2017-11-06 MED ORDER — PHENYLEPHRINE 40 MCG/ML (10ML) SYRINGE FOR IV PUSH (FOR BLOOD PRESSURE SUPPORT)
80.0000 ug | PREFILLED_SYRINGE | INTRAVENOUS | Status: DC | PRN
  Filled 2017-11-06: qty 10
  Filled 2017-11-06: qty 5

## 2017-11-06 MED ORDER — EPHEDRINE 5 MG/ML INJ
10.0000 mg | INTRAVENOUS | Status: DC | PRN
  Filled 2017-11-06: qty 2

## 2017-11-06 MED ORDER — ONDANSETRON HCL 4 MG/2ML IJ SOLN
4.0000 mg | Freq: Four times a day (QID) | INTRAMUSCULAR | Status: DC | PRN
  Administered 2017-11-06: 4 mg via INTRAVENOUS
  Filled 2017-11-06: qty 2

## 2017-11-06 MED ORDER — SENNOSIDES-DOCUSATE SODIUM 8.6-50 MG PO TABS
2.0000 | ORAL_TABLET | ORAL | Status: DC
  Administered 2017-11-06 – 2017-11-07 (×2): 2 via ORAL
  Filled 2017-11-06 (×2): qty 2

## 2017-11-06 MED ORDER — LACTATED RINGERS IV SOLN: 500.0000 mL | Freq: Once | INTRAVENOUS | Status: DC

## 2017-11-06 MED ORDER — ZOLPIDEM TARTRATE 5 MG PO TABS: 5.0000 mg | ORAL_TABLET | Freq: Every evening | ORAL | Status: DC | PRN

## 2017-11-06 MED ORDER — DIBUCAINE 1 % RE OINT: 1.0000 "application " | TOPICAL_OINTMENT | RECTAL | Status: DC | PRN

## 2017-11-06 MED ORDER — WITCH HAZEL-GLYCERIN EX PADS: 1.0000 "application " | MEDICATED_PAD | CUTANEOUS | Status: DC | PRN

## 2017-11-06 MED ORDER — OXYTOCIN 40 UNITS IN LACTATED RINGERS INFUSION - SIMPLE MED: 2.5000 [IU]/h | INTRAVENOUS | Status: DC

## 2017-11-06 MED ORDER — BENZOCAINE-MENTHOL 20-0.5 % EX AERO
1.0000 "application " | INHALATION_SPRAY | CUTANEOUS | Status: DC | PRN
  Administered 2017-11-06: 1 via TOPICAL
  Filled 2017-11-06: qty 56

## 2017-11-06 MED ORDER — LORATADINE 10 MG PO TABS
10.0000 mg | ORAL_TABLET | Freq: Every day | ORAL | Status: DC
  Administered 2017-11-07: 10 mg via ORAL
  Filled 2017-11-06: qty 1

## 2017-11-06 MED ORDER — BUPIVACAINE HCL (PF) 0.25 % IJ SOLN
INTRAMUSCULAR | Status: DC | PRN
  Administered 2017-11-06: 8 mL via EPIDURAL

## 2017-11-06 NOTE — Anesthesia Preprocedure Evaluation (Signed)
Anesthesia Evaluation  Patient identified by MRN, date of birth, ID band Patient awake    Reviewed: Allergy & Precautions, NPO status , Patient's Chart, lab work & pertinent test results  Airway Mallampati: II  TM Distance: >3 FB Neck ROM: Full    Dental no notable dental hx.    Pulmonary neg pulmonary ROS,    Pulmonary exam normal breath sounds clear to auscultation       Cardiovascular hypertension (gestational), Normal cardiovascular exam Rhythm:Regular Rate:Normal     Neuro/Psych negative neurological ROS  negative psych ROS   GI/Hepatic negative GI ROS, Neg liver ROS,   Endo/Other  negative endocrine ROS  Renal/GU negative Renal ROS  negative genitourinary   Musculoskeletal negative musculoskeletal ROS (+)   Abdominal   Peds  Hematology negative hematology ROS (+)   Anesthesia Other Findings   Reproductive/Obstetrics (+) Pregnancy                             Anesthesia Physical Anesthesia Plan  ASA: II  Anesthesia Plan: Epidural   Post-op Pain Management:    Induction:   PONV Risk Score and Plan: Treatment may vary due to age or medical condition  Airway Management Planned: Natural Airway  Additional Equipment:   Intra-op Plan:   Post-operative Plan:   Informed Consent: I have reviewed the patients History and Physical, chart, labs and discussed the procedure including the risks, benefits and alternatives for the proposed anesthesia with the patient or authorized representative who has indicated his/her understanding and acceptance.     Plan Discussed with: Anesthesiologist  Anesthesia Plan Comments: (Patient identified. Risks, benefits, options discussed with patient including but not limited to bleeding, infection, nerve damage, paralysis, failed block, incomplete pain control, headache, blood pressure changes, nausea, vomiting, reactions to medication, itching, and  post partum back pain. Confirmed with bedside nurse the patient's most recent platelet count. Confirmed with the patient that they are not taking any anticoagulation, have any bleeding history or any family history of bleeding disorders. Patient expressed understanding and wishes to proceed. All questions were answered. )        Anesthesia Quick Evaluation

## 2017-11-06 NOTE — Progress Notes (Signed)
C/c/+2, push 

## 2017-11-06 NOTE — Anesthesia Pain Management Evaluation Note (Signed)
  CRNA Pain Management Visit Note  Patient: Katie Smith, 37 y.o., female  "Hello I am a member of the anesthesia team at [facility]. We have an anesthesia team available at all times to provide care throughout the hospital, including epidural management and anesthesia for C-section. I don't know your plan for the delivery whether it a natural birth, water birth, IV sedation, nitrous supplementation, doula or epidural, but we want to meet your pain goals."   1.Was your pain managed to your expectations on prior hospitalizations?   Yes   2.What is your expectation for pain management during this hospitalization?     Epidural  3.How can we help you reach that goal?   Record the patient's initial score and the patient's pain goal.   Pain: 0  Pain Goal: 3 [facility] wants you to be able to say your pain was always managed very well.  Laban Emperor 11/06/2017

## 2017-11-06 NOTE — Anesthesia Procedure Notes (Signed)
Epidural Patient location during procedure: OB Start time: 11/06/2017 8:45 AM End time: 11/06/2017 9:00 AM  Staffing Anesthesiologist: Elmer Picker, MD Performed: anesthesiologist   Preanesthetic Checklist Completed: patient identified, pre-op evaluation, timeout performed, IV checked, risks and benefits discussed and monitors and equipment checked  Epidural Patient position: sitting Prep: site prepped and draped and DuraPrep Patient monitoring: continuous pulse ox, blood pressure, heart rate and cardiac monitor Approach: midline Location: L3-L4 Injection technique: LOR air  Needle:  Needle type: Tuohy  Needle gauge: 17 G Needle length: 9 cm Needle insertion depth: 7 cm Catheter type: closed end flexible Catheter size: 19 Gauge Catheter at skin depth: 14 cm Test dose: negative  Assessment Sensory level: T8 Events: blood not aspirated, injection not painful, no injection resistance, negative IV test and no paresthesia  Additional Notes Patient identified. Risks/Benefits/Options discussed with patient including but not limited to bleeding, infection, nerve damage, paralysis, failed block, incomplete pain control, headache, blood pressure changes, nausea, vomiting, reactions to medication both or allergic, itching and postpartum back pain. Confirmed with bedside nurse the patient's most recent platelet count. Confirmed with patient that they are not currently taking any anticoagulation, have any bleeding history or any family history of bleeding disorders. Patient expressed understanding and wished to proceed. All questions were answered. Sterile technique was used throughout the entire procedure. Please see nursing notes for vital signs. Test dose was given through epidural catheter and negative prior to continuing to dose epidural or start infusion. Warning signs of high block given to the patient including shortness of breath, tingling/numbness in hands, complete motor block,  or any concerning symptoms with instructions to call for help. Patient was given instructions on fall risk and not to get out of bed. All questions and concerns addressed with instructions to call with any issues or inadequate analgesia.  Reason for block:procedure for pain

## 2017-11-06 NOTE — Progress Notes (Signed)
AROM for clear fluid. SVE 3.5/50/-2. Pitocin.

## 2017-11-07 LAB — CBC
HCT: 29.9 % — ABNORMAL LOW (ref 36.0–46.0)
HEMOGLOBIN: 10.3 g/dL — AB (ref 12.0–15.0)
MCH: 31.6 pg (ref 26.0–34.0)
MCHC: 34.4 g/dL (ref 30.0–36.0)
MCV: 91.7 fL (ref 78.0–100.0)
Platelets: 137 10*3/uL — ABNORMAL LOW (ref 150–400)
RBC: 3.26 MIL/uL — AB (ref 3.87–5.11)
RDW: 14.1 % (ref 11.5–15.5)
WBC: 14.4 10*3/uL — ABNORMAL HIGH (ref 4.0–10.5)

## 2017-11-07 NOTE — Anesthesia Postprocedure Evaluation (Signed)
Anesthesia Post Note  Patient: Katie Smith  Procedure(s) Performed: AN AD HOC LABOR EPIDURAL     Patient location during evaluation: Mother Baby Anesthesia Type: Epidural Level of consciousness: awake, awake and alert and oriented Pain management: pain level controlled Respiratory status: spontaneous breathing and respiratory function stable Cardiovascular status: blood pressure returned to baseline Postop Assessment: no headache, no backache, epidural receding, patient able to bend at knees, no apparent nausea or vomiting, adequate PO intake and able to ambulate Anesthetic complications: no    Last Vitals:  Vitals:   11/06/17 2300 11/07/17 0327  BP: 128/78 119/78  Pulse: 96 88  Resp: 18 18  Temp: 36.9 C 37 C  SpO2: 97% 98%    Last Pain:  Vitals:   11/07/17 0525  TempSrc:   PainSc: 0-No pain   Pain Goal:                 Cleda Clarks

## 2017-11-07 NOTE — Progress Notes (Signed)
Post Partum Day 1 Subjective: no complaints and up ad lib  Objective: Blood pressure 113/89, pulse 82, temperature 98.1 F (36.7 C), temperature source Oral, resp. rate 18, height 5\' 8"  (1.727 m), weight 121.9 kg, last menstrual period 02/06/2017, SpO2 96 %, unknown if currently breastfeeding.  Physical Exam:  General: alert, cooperative and no distress Lochia: appropriate Uterine Fundus: firm Incision: healing well DVT Evaluation: No evidence of DVT seen on physical exam.  Recent Labs    11/06/17 0745 11/07/17 0548  HGB 11.2* 10.3*  HCT 33.8* 29.9*    Assessment/Plan: Plan for discharge tomorrow and Breastfeeding   LOS: 1 day   Turner Daniels 11/07/2017, 9:23 AM

## 2017-11-07 NOTE — Lactation Note (Signed)
This note was copied from a baby's chart. Lactation Consultation Note  Patient Name: Girl Naydelin Ziegler [id number] Date: 11/07/2017 Reason for consult: Initial assessment;Term Breastfeeding consultation services and support information given and reviewed.  This is mom's second baby.  Her first baby would not latch so she pumped and bottle fed.  Mom states newborn has been latching with ease but sleepy today.  Baby showing feeding cues.  Assisted with positioning baby in cross cradle hold.  Mom has erect nipples and compressible breasts.  Mom shown how to compress tissue for a deeper latch.  Baby latched easily and well.  Mom had some initial latch on pain which improved as baby fed.  Instructed to feed baby with any cue and use good waking techniques and breast massage.  Encourage to call with concerns of assist.  Maternal Data Does the patient have breastfeeding experience prior to this delivery?: Yes  Feeding Feeding Type: Breast Fed Length of feed: 20 min  LATCH Score Latch: Grasps breast easily, tongue down, lips flanged, rhythmical sucking.  Audible Swallowing: A few with stimulation  Type of Nipple: Everted at rest and after stimulation  Comfort (Breast/Nipple): Soft / non-tender  Hold (Positioning): Assistance needed to correctly position infant at breast and maintain latch.  LATCH Score: 8  Interventions Interventions: Breast feeding basics reviewed;Assisted with latch;Breast compression;Skin to skin;Adjust position;Breast massage;Support pillows  Lactation Tools Discussed/Used     Consult Status Consult Status: Follow-up Date: 11/08/17 Follow-up type: In-patient    Huston Foley 11/07/2017, 11:52 AM

## 2017-11-07 NOTE — Progress Notes (Signed)
Parent request formula to supplement breast feeding due to feeling like infant isn't getting enough milk. Parents have been informed of small tummy size of newborn, taught hand expression and understands the possible consequences of formula to the health of the infant. The possible consequences shared with patent include 1) Loss of confidence in breastfeeding 2) Engorgement 3) Allergic sensitization of baby(asthema/allergies) and 4) decreased milk supply for mother.After discussion of the above the mother decided to give formula.The  tool used to give formula supplement will be bottle with nipple.

## 2017-11-08 NOTE — Lactation Note (Signed)
This note was copied from a baby's chart. Lactation Consultation Note  Patient Name: Katie Smith [id number] Date: 11/08/2017 Reason for consult: Follow-up assessment Baby started cluster feeding last night so parents started formula supplementation.  Baby latched this morning to both breasts but still hungry after feeding.  Mom will supplement with formula.  Instructed to put baby to breast first with cues and post pump every 3 hours.  Supplement baby with expressed milk/formula per guidelines.  Mom does have a DEBP for home use.  Lactation outpatient services and support reviewed and encouraged prn.  Maternal Data    Feeding    LATCH Score                   Interventions    Lactation Tools Discussed/Used     Consult Status Consult Status: Complete Follow-up type: Call as needed    Huston Foley 11/08/2017, 9:17 AM

## 2017-11-08 NOTE — Discharge Summary (Signed)
Obstetric Discharge Summary Reason for Admission: induction of labor Prenatal Procedures: none Intrapartum Procedures: spontaneous vaginal delivery Postpartum Procedures: none Complications-Operative and Postpartum: first degree perineal laceration Hemoglobin  Date Value Ref Range Status  11/07/2017 10.3 (L) 12.0 - 15.0 g/dL Final   HCT  Date Value Ref Range Status  11/07/2017 29.9 (L) 36.0 - 46.0 % Final    Physical Exam:  General: alert, cooperative and appears stated age 37: appropriate Uterine Fundus: firm Incision: healing well, no significant drainage, no dehiscence DVT Evaluation: No evidence of DVT seen on physical exam.  Discharge Diagnoses: Term Pregnancy-delivered  Discharge Information: Date: 11/08/2017 Activity: pelvic rest Diet: routine Medications: None Condition: improved Instructions: refer to practice specific booklet Discharge to: home   Newborn Data: Live born female  Birth Weight: 7 lb 15.5 oz (3615 g) APGAR: 8, 9  Newborn Delivery   Birth date/time:  11/06/2017 16:49:00 Delivery type:  Vaginal, Spontaneous     Home with mother.  Duc Crocket L 11/08/2017, 9:43 AM

## 2017-11-13 ENCOUNTER — Inpatient Hospital Stay (HOSPITAL_COMMUNITY): Admission: AD | Admit: 2017-11-13 | Payer: 59 | Source: Ambulatory Visit | Admitting: Obstetrics & Gynecology

## 2018-09-21 IMAGING — US US MFM OB FOLLOW-UP
1 series · 14 of 28 positions shown · non-contrast
Comparison: none

[Series 1: us mfm ob follow-up · 14 of 69 slices shown]
[im 3/69]
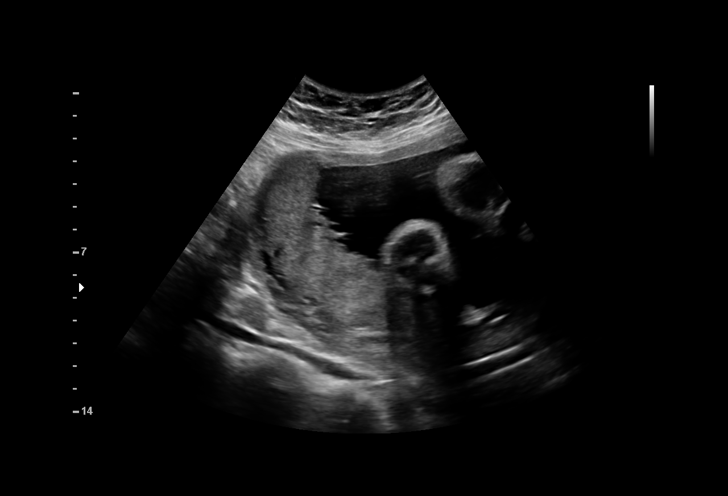
[im 8/69]
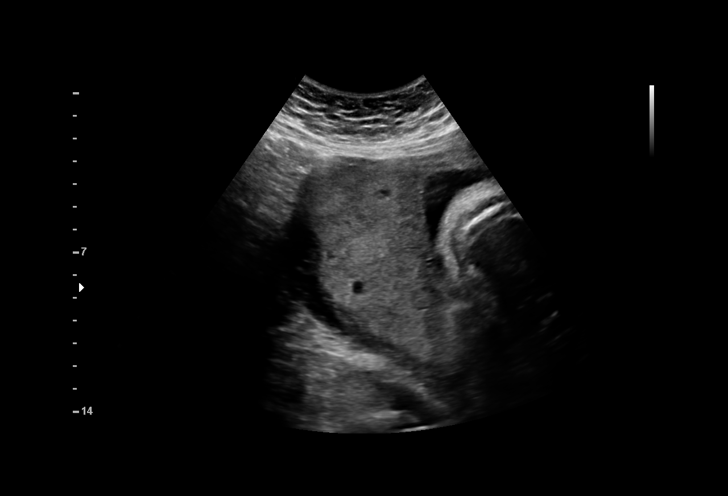
[im 13/69]
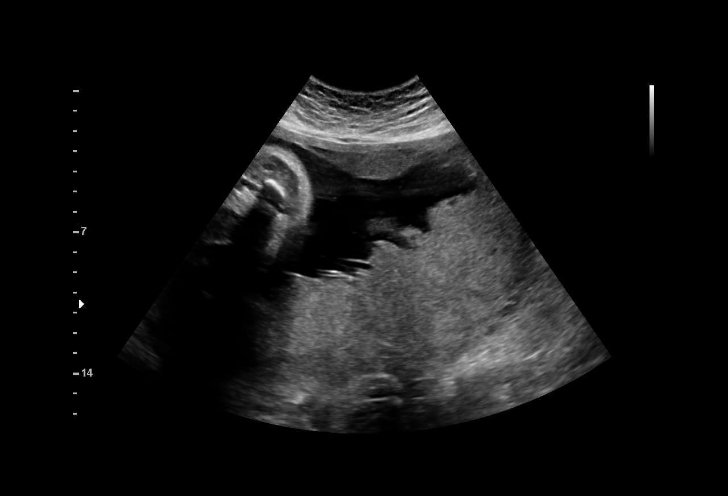
[im 18/69]
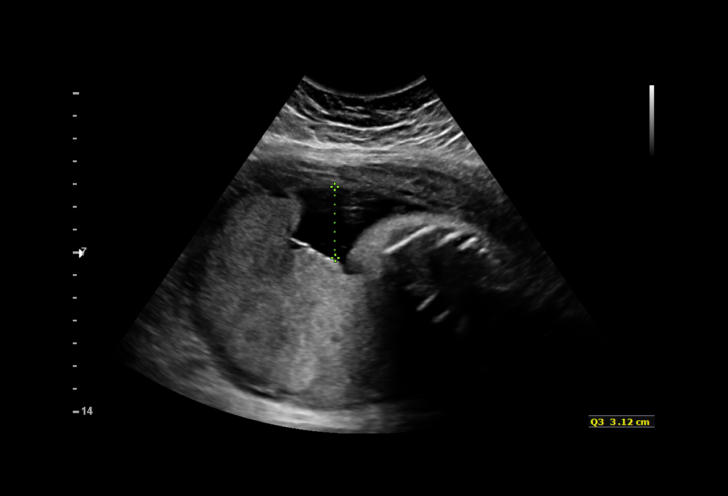
[im 23/69]
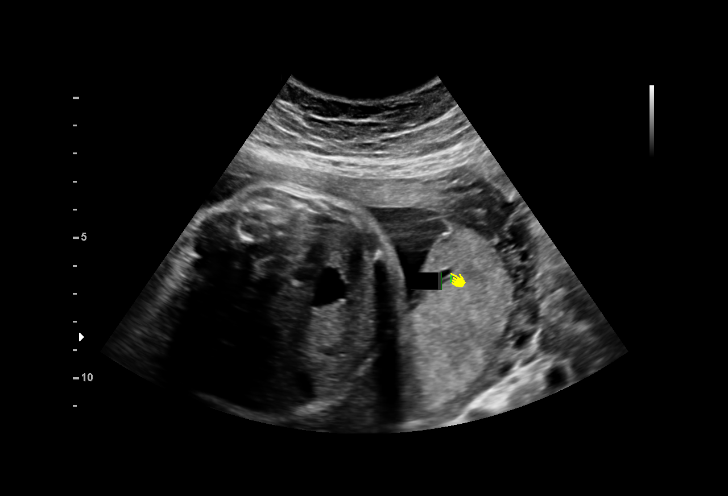
[im 28/69]
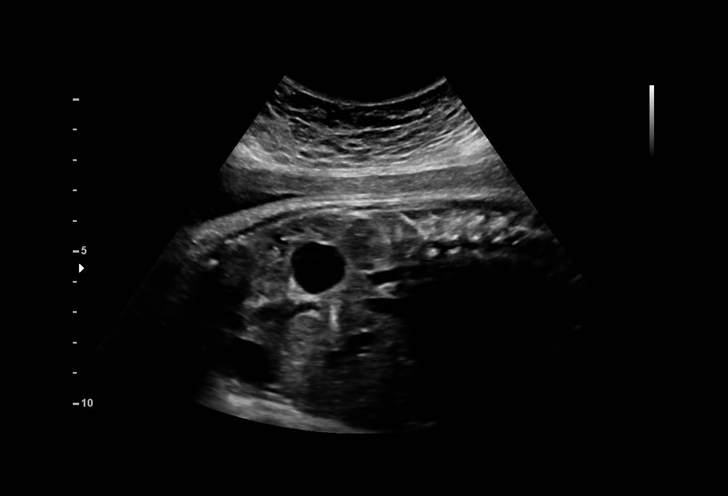
[im 33/69]
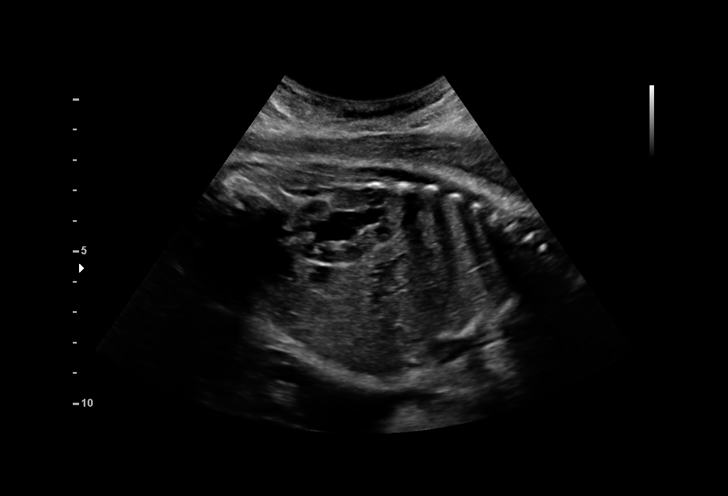
[im 38/69]
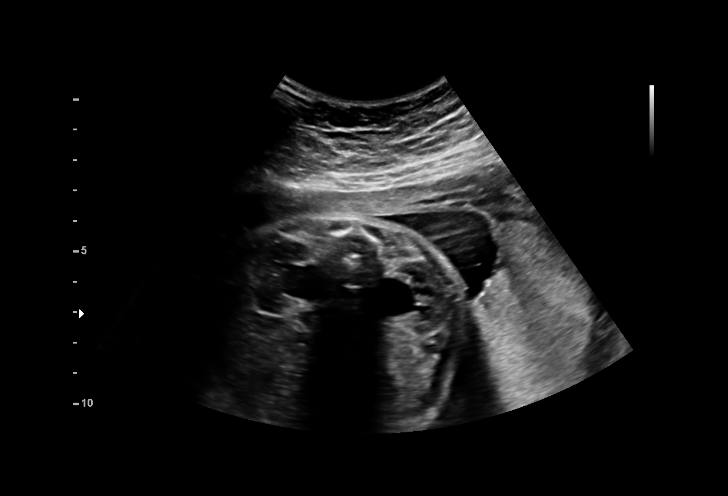
[im 43/69]
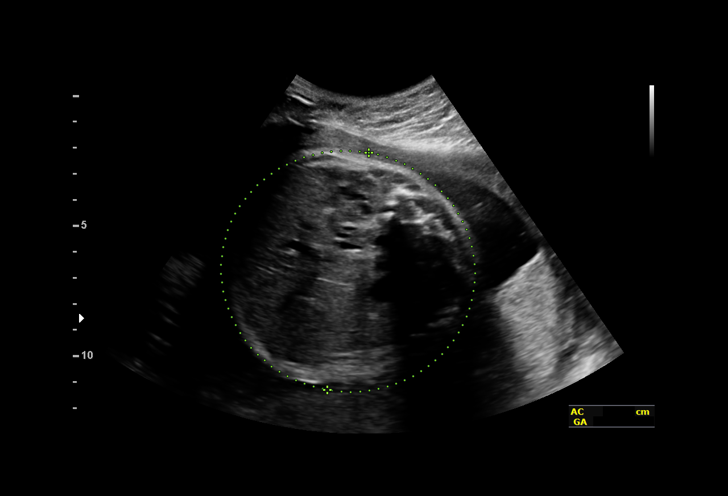
[im 48/69]
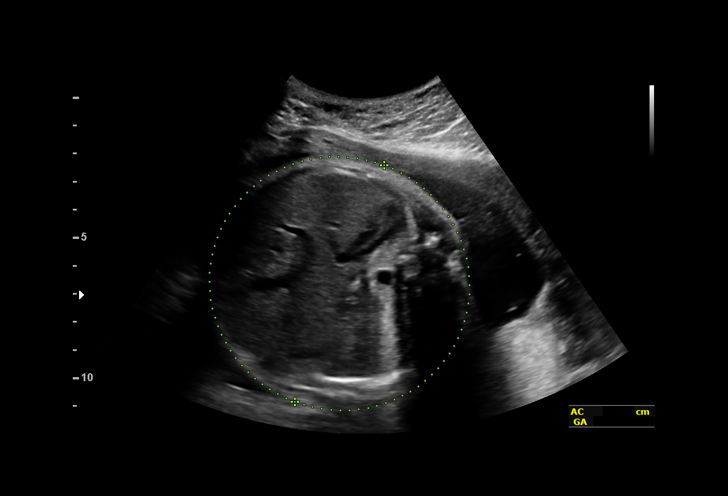
[im 53/69]
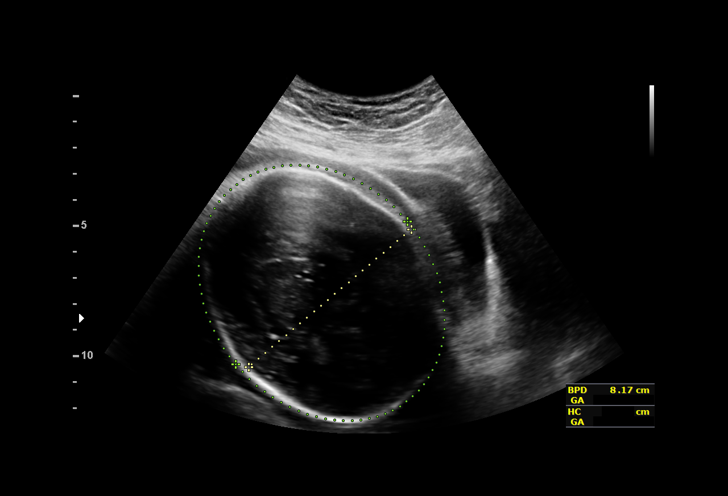
[im 58/69]
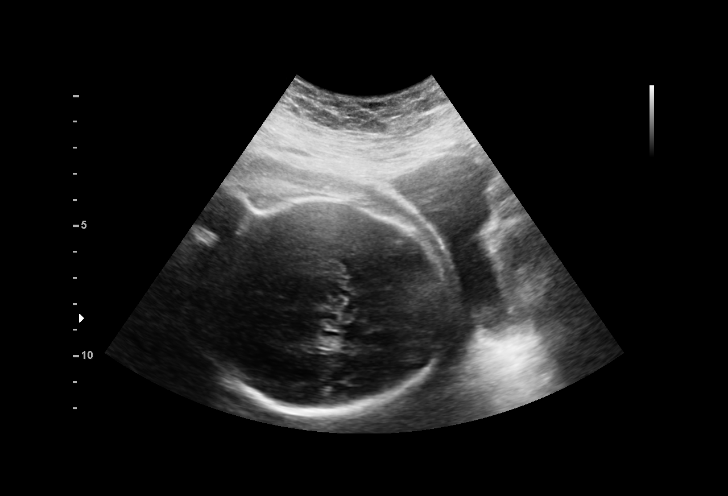
[im 63/69]
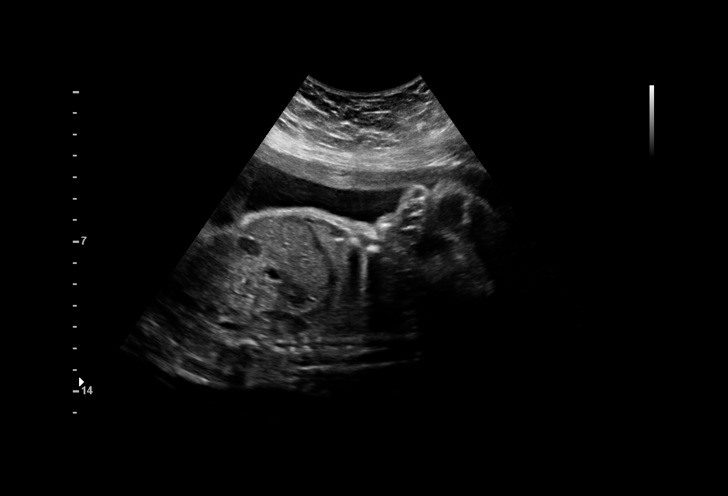
[im 69/69]
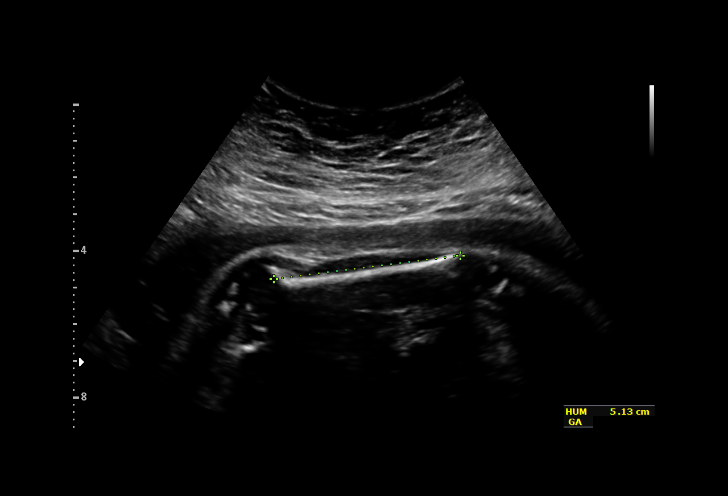

[14 of 28 positions shown; findings below may reference images not displayed]

[REDACTED]. [HOSPITAL]
DO

1  NAZARETH JUMPER            [id number]      [id number]     [id number]
Indications

32 weeks gestation of pregnancy
Abnormal biochemical screen (quad) for
Trisomy 18 ([DATE]) Normal Panorama
Fetal abnormality - other known or
suspected ( renal pyelectasis)
OB History

Gravidity:    2         Term:   0        Prem:   0        SAB:   1
TOP:          0       Ectopic:  0        Living: 0
Fetal Evaluation

Num Of Fetuses:     1
Fetal Heart         133
Rate(bpm):
Cardiac Activity:   Observed
Presentation:       Cephalic
Placenta:           Posterior, above cervical os
P. Cord Insertion:  Previously Visualized

Amniotic Fluid
AFI FV:      Subjectively within normal limits

AFI Sum(cm)     %Tile       Largest Pocket(cm)
14.83           52

RUQ(cm)       RLQ(cm)       LUQ(cm)        LLQ(cm)
5.69
Biometry

BPD:      81.7  mm     G. Age:  32w 6d         55  %    CI:        74.43   %    70 - 86
FL/HC:      19.2   %    19.1 -
HC:      300.6  mm     G. Age:  33w 3d         37  %    HC/AC:      1.05        0.96 -
AC:      286.4  mm     G. Age:  32w 4d         56  %    FL/BPD:     70.7   %    71 - 87
FL:       57.8  mm     G. Age:  30w 2d        < 3  %    FL/AC:      20.2   %    20 - 24
HUM:      51.2  mm     G. Age:  30w 0d        < 5  %

Est. FW:    0774  gm      4 lb 3 oz     49  %
Gestational Age

LMP:           32w 3d        Date:  03/17/15                 EDD:   12/22/15
U/S Today:     32w 2d                                        EDD:   12/23/15
Best:          32w 3d     Det. By:  LMP  (03/17/15)          EDD:   12/22/15
Anatomy

Cranium:               Appears normal         Aortic Arch:            Previously seen
Cavum:                 Previously seen        Ductal Arch:            Previously seen
Ventricles:            Appears normal         Diaphragm:              Appears normal
Choroid Plexus:        Previously seen        Stomach:                Appears normal, left
sided
Cerebellum:            Previously seen        Abdomen:                Previously seen
Posterior Fossa:       Previously seen        Abdominal Wall:         Previously seen
Nuchal Fold:           Previously seen        Cord Vessels:           Previously seen
Face:                  Orbits and profile     Kidneys:                Bilat
previously seen
pyelectasis,2tOCm
m, 6tEUmm
Lips:                  Previously seen        Bladder:                Appears normal
Thoracic:              Appears normal         Spine:                  Previously seen
Heart:                 Appears normal         Upper Extremities:      Previously seen
(4CH, axis, and situs
RVOT:                  Previously seen        Lower Extremities:      Previously seen
LVOT:                  Previously seen

Other:  Previously the fetus appears to be a male. Nasal bone previously
visualized. Technically difficult due to fetal position and maternal body
habitus.
Cervix Uterus Adnexa

Cervix
Not visualized (advanced GA >30wks)
Impression

Singleton intrauterine pregnancy at 32 weeks 3 days
gestation with fetal cardiac activity
Cephalic presentation
Normal appearing fetal growth and amnioitic fluid volume
Bilateral UTD right 12 mm and left 14 mm
Recommendations

Recommend follow-up ultrasound examination as scheduled
